# Patient Record
Sex: Male | Born: 1980 | Race: Black or African American | Hispanic: No | Marital: Single | Smoking: Never smoker
Health system: Southern US, Community
[De-identification: ages and names within clinical notes are randomized; demographics above are authoritative.]

## PROBLEM LIST (undated history)

## (undated) DIAGNOSIS — I10 Essential (primary) hypertension: Secondary | ICD-10-CM

## (undated) HISTORY — PX: APPENDECTOMY: SHX54

---

## 2004-05-31 ENCOUNTER — Emergency Department: Payer: Self-pay | Admitting: Emergency Medicine

## 2006-04-22 ENCOUNTER — Emergency Department: Payer: Self-pay | Admitting: Internal Medicine

## 2007-02-16 ENCOUNTER — Emergency Department: Payer: Self-pay | Admitting: Emergency Medicine

## 2007-04-18 ENCOUNTER — Emergency Department: Payer: Self-pay | Admitting: Internal Medicine

## 2007-08-05 ENCOUNTER — Emergency Department: Payer: Self-pay | Admitting: Internal Medicine

## 2009-09-07 ENCOUNTER — Emergency Department: Payer: Self-pay | Admitting: Unknown Physician Specialty

## 2012-01-08 ENCOUNTER — Emergency Department: Payer: Self-pay | Admitting: Emergency Medicine

## 2012-06-24 ENCOUNTER — Emergency Department: Payer: Self-pay | Admitting: Emergency Medicine

## 2013-06-23 ENCOUNTER — Emergency Department: Payer: Self-pay | Admitting: Emergency Medicine

## 2013-09-20 ENCOUNTER — Emergency Department: Payer: Self-pay | Admitting: Emergency Medicine

## 2013-09-22 LAB — BETA STREP CULTURE(ARMC)

## 2014-01-27 ENCOUNTER — Emergency Department: Payer: Self-pay | Admitting: Emergency Medicine

## 2014-01-29 ENCOUNTER — Emergency Department: Payer: Self-pay | Admitting: Emergency Medicine

## 2014-01-29 LAB — COMPREHENSIVE METABOLIC PANEL
ALBUMIN: 4.5 g/dL (ref 3.4–5.0)
ALT: 103 U/L — AB
Alkaline Phosphatase: 60 U/L
Anion Gap: 18 — ABNORMAL HIGH (ref 7–16)
BUN: 17 mg/dL (ref 7–18)
Bilirubin,Total: 0.3 mg/dL (ref 0.2–1.0)
CALCIUM: 8.6 mg/dL (ref 8.5–10.1)
CHLORIDE: 101 mmol/L (ref 98–107)
CO2: 21 mmol/L (ref 21–32)
Creatinine: 1.5 mg/dL — ABNORMAL HIGH (ref 0.60–1.30)
EGFR (African American): >60
EGFR (Non-African Amer.): 58 — ABNORMAL LOW
Glucose: 142 mg/dL — ABNORMAL HIGH (ref 65–99)
Osmolality: 283 (ref 275–301)
Potassium: 2.9 mmol/L — ABNORMAL LOW (ref 3.5–5.1)
SGOT(AST): 65 U/L — ABNORMAL HIGH (ref 15–37)
SODIUM: 140 mmol/L (ref 136–145)
Total Protein: 9.3 g/dL — ABNORMAL HIGH (ref 6.4–8.2)

## 2014-01-29 LAB — CBC
HCT: 47.8 % (ref 40.0–52.0)
HGB: 15.4 g/dL (ref 13.0–18.0)
MCH: 27.6 pg (ref 26.0–34.0)
MCHC: 32.2 g/dL (ref 32.0–36.0)
MCV: 86 fL (ref 80–100)
Platelet: 236 10*3/uL (ref 150–440)
RBC: 5.58 10*6/uL (ref 4.40–5.90)
RDW: 18 % — AB (ref 11.5–14.5)
WBC: 12.2 10*3/uL — AB (ref 3.8–10.6)

## 2014-01-31 ENCOUNTER — Emergency Department: Payer: Self-pay | Admitting: Emergency Medicine

## 2014-07-11 ENCOUNTER — Emergency Department
Admit: 2014-07-11 | Disposition: A | Discharge status: Home / Self Care | Payer: Self-pay | Admitting: Emergency Medicine

## 2014-07-13 LAB — BETA STREP CULTURE(ARMC)

## 2015-10-28 IMAGING — CR RIGHT MIDDLE FINGER 2+V
1 series · 3 of 3 positions shown · non-contrast
Comparison: None.

CLINICAL DATA: Pain about the proximal aspect of the right long
finger with swelling. Symptoms for 3 days. Initial encounter. No
known injury.

EXAM:
RIGHT MIDDLE FINGER 2+V

[Series 1: pa · 0.17mm/px · 3 of 3 slices shown]
[im 1/3]
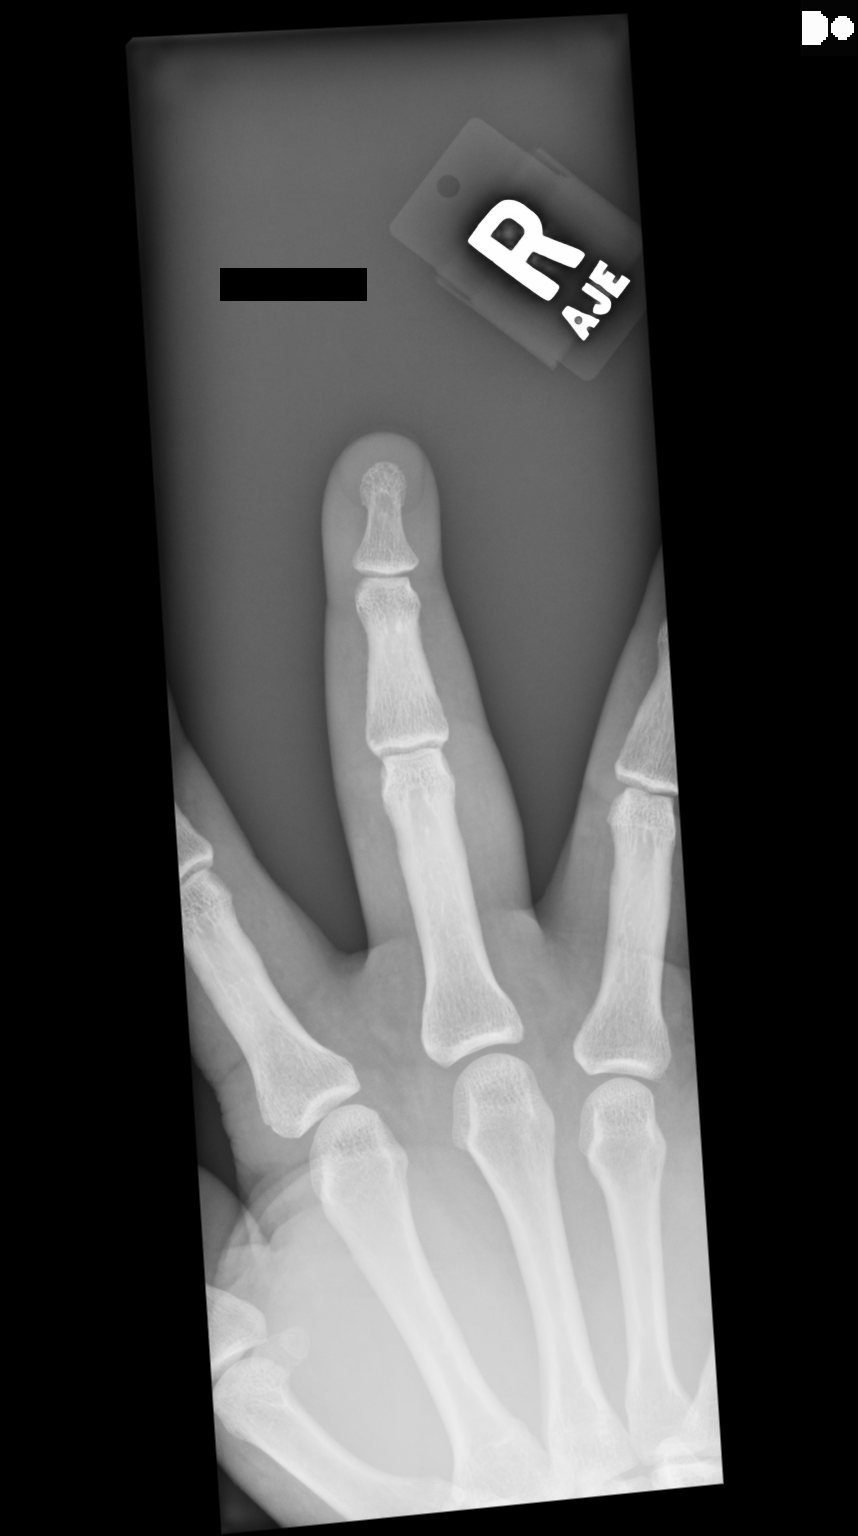
[im 2/3]
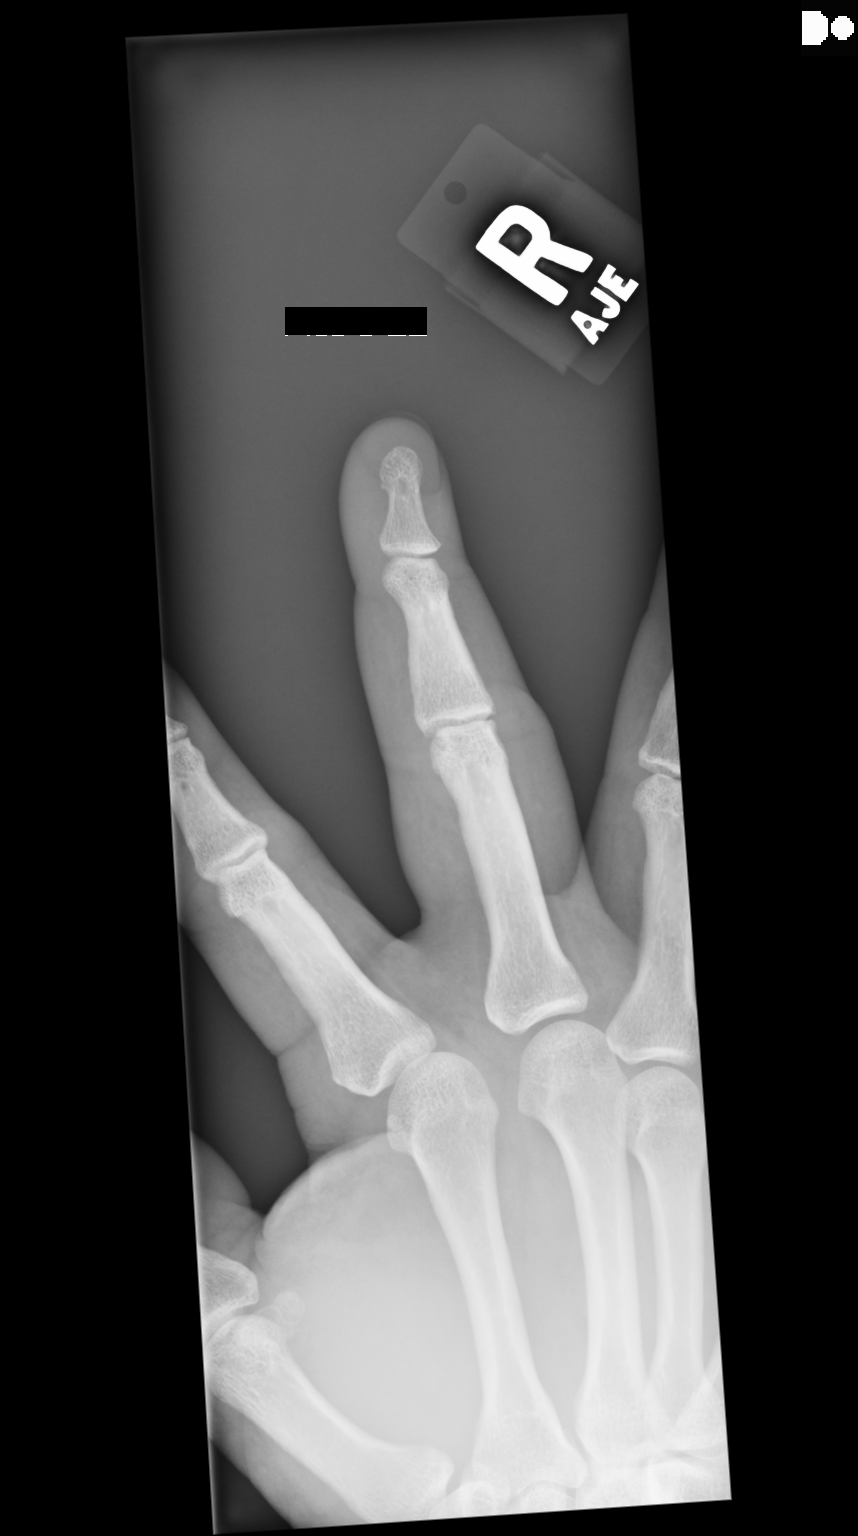
[im 3/3]
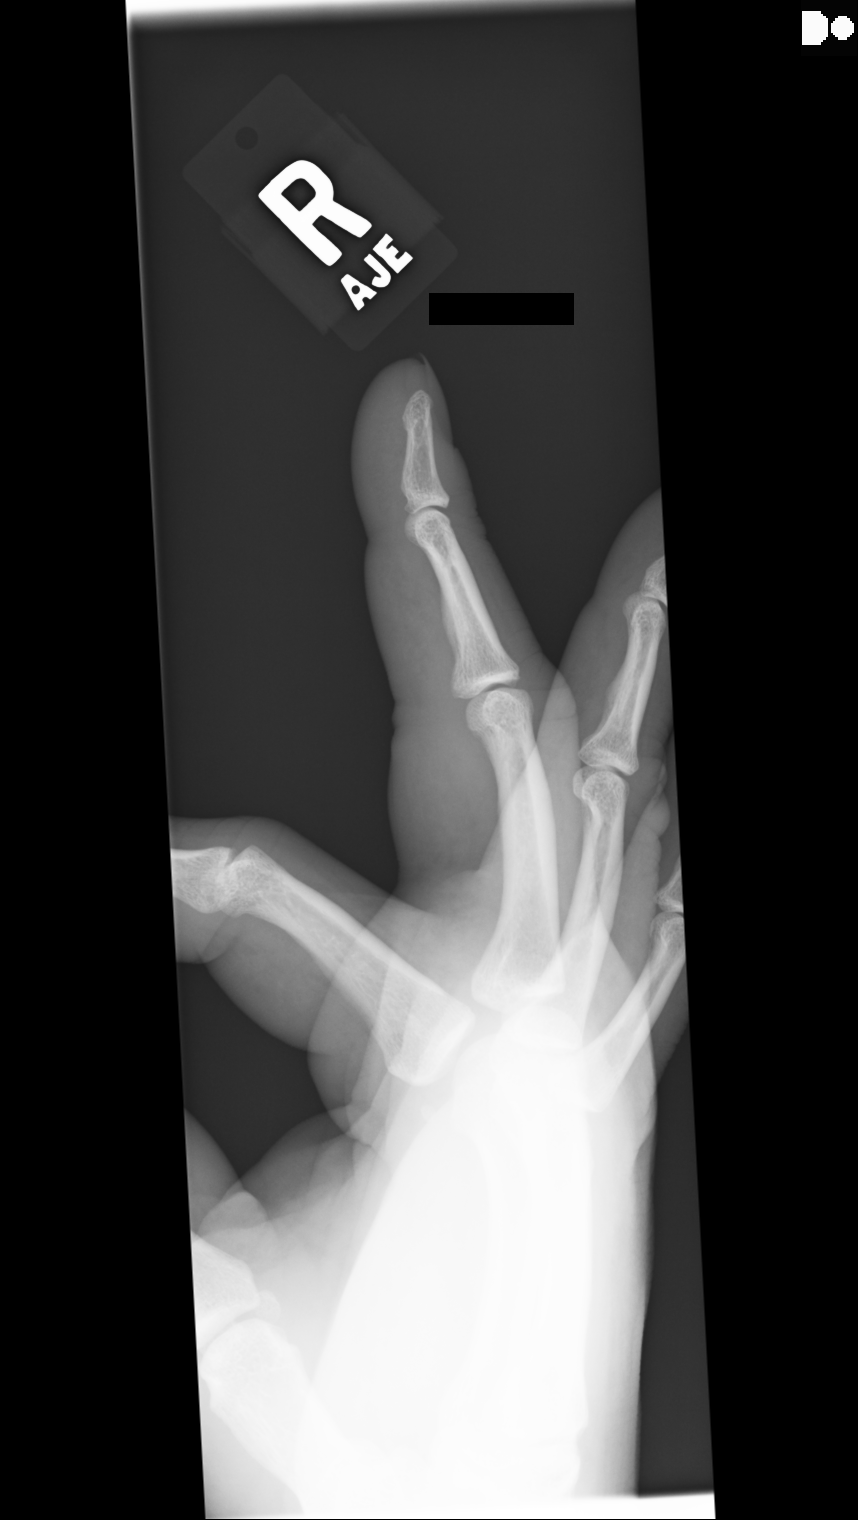

[3 of 3 positions shown; findings below may reference images not displayed]

FINDINGS: Soft tissues of the right long finger appear swollen. No soft tissue
gas collection or radiopaque foreign body is identified. Image bones
and joints appear normal.
IMPRESSION: Soft tissue swelling.  Otherwise negative.

## 2017-05-29 ENCOUNTER — Other Ambulatory Visit: Payer: Self-pay | Admitting: Family Medicine

## 2017-05-29 DIAGNOSIS — R748 Abnormal levels of other serum enzymes: Secondary | ICD-10-CM

## 2017-06-11 ENCOUNTER — Ambulatory Visit: Payer: BLUE CROSS/BLUE SHIELD

## 2018-09-23 ENCOUNTER — Emergency Department
Admission: EM | Admit: 2018-09-23 | Discharge: 2018-09-23 | Disposition: A | Discharge status: Home / Self Care | Payer: No Typology Code available for payment source | Attending: Emergency Medicine | Admitting: Emergency Medicine

## 2018-09-23 ENCOUNTER — Other Ambulatory Visit: Payer: Self-pay

## 2018-09-23 ENCOUNTER — Emergency Department: Payer: No Typology Code available for payment source

## 2018-09-23 ENCOUNTER — Encounter: Payer: Self-pay | Admitting: Emergency Medicine

## 2018-09-23 DIAGNOSIS — Y9389 Activity, other specified: Secondary | ICD-10-CM | POA: Insufficient documentation

## 2018-09-23 DIAGNOSIS — S51812A Laceration without foreign body of left forearm, initial encounter: Secondary | ICD-10-CM | POA: Insufficient documentation

## 2018-09-23 DIAGNOSIS — Y998 Other external cause status: Secondary | ICD-10-CM | POA: Insufficient documentation

## 2018-09-23 DIAGNOSIS — Y92410 Unspecified street and highway as the place of occurrence of the external cause: Secondary | ICD-10-CM | POA: Insufficient documentation

## 2018-09-23 DIAGNOSIS — Y999 Unspecified external cause status: Secondary | ICD-10-CM | POA: Insufficient documentation

## 2018-09-23 DIAGNOSIS — T148XXA Other injury of unspecified body region, initial encounter: Secondary | ICD-10-CM

## 2018-09-23 DIAGNOSIS — I10 Essential (primary) hypertension: Secondary | ICD-10-CM | POA: Insufficient documentation

## 2018-09-23 HISTORY — DX: Essential (primary) hypertension: I10

## 2018-09-23 MED ORDER — SODIUM CHLORIDE 0.9 % IV BOLUS: 1000.0000 mL | Freq: Once | INTRAVENOUS | Status: DC
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
## 2018-09-23 NOTE — ED Notes (Signed)
Legal blood draw performed by this nurse with [facility]. Pt tolerated well.

## 2018-09-23 NOTE — Discharge Instructions (Addendum)
Please keep your cuts/abrasions clean, you may wash with warm water and mild soap.  Keep covered with Neosporin and a dressing/bandage.  Return to the emergency department for development of any other symptoms such as significant headache, chest pain or abdominal pain.

## 2019-04-25 ENCOUNTER — Encounter: Payer: Self-pay | Admitting: Intensive Care

## 2019-04-25 ENCOUNTER — Emergency Department
Admission: EM | Admit: 2019-04-25 | Discharge: 2019-04-25 | Disposition: A | Discharge status: Home / Self Care | Payer: Self-pay | Attending: Emergency Medicine | Admitting: Emergency Medicine

## 2019-04-25 ENCOUNTER — Other Ambulatory Visit: Payer: Self-pay

## 2019-04-25 DIAGNOSIS — I10 Essential (primary) hypertension: Secondary | ICD-10-CM | POA: Insufficient documentation

## 2019-04-25 DIAGNOSIS — Z79899 Other long term (current) drug therapy: Secondary | ICD-10-CM | POA: Insufficient documentation

## 2019-04-25 MED ORDER — AMLODIPINE BESYLATE 5 MG PO TABS: 5.0000 mg | ORAL_TABLET | Freq: Every day | ORAL | 0 refills | Status: AC

## 2019-04-25 NOTE — ED Provider Notes (Signed)
[facility] Emergency Department Provider Note  ____________________________________________  Time seen: Approximately 4:40 PM  I have reviewed the triage vital signs and the nursing notes.   HISTORY  Chief Complaint Hypertension    HPI Dennis Jensen is a 39 y.o. male that presents to the emergency department requesting blood pressure medication.  Patient states that he had a short episode yesterday where he felt dizzy.  Patient is unsure of how long episode lasted but states that it was short.  After episode, patient felt normal.  Patient has not had his blood pressure medication in 2 months.  He was taking amlodipine and hydrochlorothiazide but is unsure of the doses.  Patient ran out of insurance and was unable to afford his medications so he quit taking them.  Patient denies headache, visual changes, weakness, shortness of breath, chest pain, abdominal pain.   Past Medical History:  Diagnosis Date  . Hypertension     There are no problems to display for this patient.   Past Surgical History:  Procedure Laterality Date  . APPENDECTOMY      Prior to Admission medications   Medication Sig Start Date End Date Taking? Authorizing Provider  amLODipine (NORVASC) 5 MG tablet Take 1 tablet (5 mg total) by mouth daily. 04/25/19 04/24/20  Laban Emperor, PA-C    Allergies Patient has no known allergies.  History reviewed. No pertinent family history.  Social History Social History   Tobacco Use  . Smoking status: Never Smoker  . Smokeless tobacco: Never Used  Substance Use Topics  . Alcohol use: Yes    Alcohol/week: 12.0 standard drinks    Types: 12 Cans of beer per week  . Drug use: Never     Review of Systems  Cardiovascular: No chest pain. Respiratory: No SOB. Gastrointestinal: No abdominal pain.  No nausea, no vomiting.  Musculoskeletal: Negative for musculoskeletal pain. Skin: Negative for rash, abrasions, lacerations,  ecchymosis. Neurological: Negative for headaches, numbness or tingling   ____________________________________________   PHYSICAL EXAM:  VITAL SIGNS: ED Triage Vitals  Enc Vitals Group     BP 04/25/19 1620 (!) 157/101     Pulse Rate 04/25/19 1620 87     Resp 04/25/19 1620 16     Temp 04/25/19 1620 98.8 F (37.1 C)     Temp Source 04/25/19 1620 Oral     SpO2 04/25/19 1620 98 %     Weight 04/25/19 1621 250 lb (113.4 kg)     Height 04/25/19 1621 5\' 10"  (1.778 m)     Head Circumference --      Peak Flow --      Pain Score 04/25/19 1621 0     Pain Loc --      Pain Edu? --      Excl. in [place]? --      Constitutional: Alert and oriented. Well appearing and in no acute distress. Eyes: Conjunctivae are normal. PERRL. EOMI. Head: Atraumatic. ENT:      Ears:      Nose: No congestion/rhinnorhea.      Mouth/Throat: Mucous membranes are moist.  Neck: No stridor.  Cardiovascular: Normal rate, regular rhythm.  Good peripheral circulation. Respiratory: Normal respiratory effort without tachypnea or retractions. Lungs CTAB. Good air entry to the bases with no decreased or absent breath sounds. Musculoskeletal: Full range of motion to all extremities. No gross deformities appreciated. Neurologic: Normal speech and language. No gross focal neurologic deficits are appreciated.  Cranial nerves: 2-10 normal as tested. Strength  5/5 in upper and lower extremities Cerebellar: Finger-nose-finger WNL, Heel to shin WNL Sensorimotor: No pronator drift, clonus, sensory loss or abnormal reflexes.  Speech: No dysarthria or expressive aphasia Skin:  Skin is warm, dry and intact. No rash noted. Psychiatric: Mood and affect are normal. Speech and behavior are normal. Patient exhibits appropriate insight and judgement.   ____________________________________________   LABS (all labs ordered are listed, but only abnormal results are displayed)  Labs Reviewed - No data to  display ____________________________________________  EKG   ____________________________________________  RADIOLOGY  No results found.  ____________________________________________    PROCEDURES  Procedure(s) performed:    Procedures    Medications - No data to display   ____________________________________________   INITIAL IMPRESSION / ASSESSMENT AND PLAN / ED COURSE  Pertinent labs & imaging results that were available during my care of the patient were reviewed by me and considered in my medical decision making (see chart for details).  Review of the [place] CSRS was performed in accordance of the [facility] prior to dispensing any controlled drugs.  Patient's diagnosis is consistent with hypertension.  Vital signs and exam are reassuring.  Neuro exam within normal limits.  Risk and benefits of imaging were discussed with the patient and he declines CT scan at this time.  Patient will be discharged home with prescriptions for amlodipine.  Patient is unsure of his dose of amlodipine and hydrochlorothiazide.  He will be given a prescription for a low dose of amlodipine and follow-up with primary care for blood pressure recheck and medication refill.  Patient is to follow up with primary care as directed. Patient is given ED precautions to return to the ED for any worsening or new symptoms.  Dennis Jensen was evaluated in Emergency Department on 04/25/2019 for the symptoms described in the history of present illness. He was evaluated in the context of the global COVID-19 pandemic, which necessitated consideration that the patient might be at risk for infection with the SARS-CoV-2 virus that causes COVID-19. Institutional protocols and algorithms that pertain to the evaluation of patients at risk for COVID-19 are in a state of rapid change based on information released by regulatory bodies including the CDC and federal and state organizations. These policies and algorithms were followed  during the patient's care in the ED.   ____________________________________________  FINAL CLINICAL IMPRESSION(S) / ED DIAGNOSES  Final diagnoses:  Hypertension, unspecified type      NEW MEDICATIONS STARTED DURING THIS VISIT:  ED Discharge Orders         Ordered    amLODipine (NORVASC) 5 MG tablet  Daily     04/25/19 1729              This chart was dictated using voice recognition software/Dragon. Despite best efforts to proofread, errors can occur which can change the meaning. Any change was purely unintentional.    Enid Derry, PA-C 04/25/19 2215    Shaune Pollack, MD 04/26/19 [phone]

## 2019-04-25 NOTE — ED Triage Notes (Signed)
Patient reports he has not had his blood pressure medicine in about two months and felt like his blood pressure is high. Denies any symptoms at this time

## 2019-07-08 ENCOUNTER — Ambulatory Visit: Payer: Self-pay | Admitting: Physician Assistant

## 2019-07-08 ENCOUNTER — Encounter: Payer: Self-pay | Admitting: Physician Assistant

## 2019-07-08 ENCOUNTER — Other Ambulatory Visit: Payer: Self-pay

## 2019-07-08 DIAGNOSIS — A5401 Gonococcal cystitis and urethritis, unspecified: Secondary | ICD-10-CM

## 2019-07-08 DIAGNOSIS — Z113 Encounter for screening for infections with a predominantly sexual mode of transmission: Secondary | ICD-10-CM

## 2019-07-08 LAB — GRAM STAIN

## 2019-07-08 MED ORDER — DOXYCYCLINE HYCLATE 100 MG PO TABS: 100.0000 mg | ORAL_TABLET | Freq: Two times a day (BID) | ORAL | 0 refills | Status: AC

## 2019-07-08 MED ORDER — CEFTRIAXONE SODIUM 250 MG IJ SOLR
500.0000 mg | Freq: Once | INTRAMUSCULAR | Status: AC
  Administered 2019-07-08: 15:00:00 500 mg via INTRAMUSCULAR

## 2019-07-08 NOTE — Progress Notes (Signed)
Gram Stain results reviewed. Patient treated for Gonorrhea per provider orders. Tawny Hopping, RN

## 2019-07-08 NOTE — Progress Notes (Signed)
   [facility] Department STI clinic/screening visit  Subjective:  Dennis Jensen is a 39 y.o. male being seen today for an STI screening visit. The patient reports they do have symptoms.    Patient has the following medical conditions:  There are no problems to display for this patient.    Chief Complaint  Patient presents with  . SEXUALLY TRANSMITTED DISEASE    HPI  Patient reports that he has had a clear/whitish discharge for the last 2 days.  Denies other symptoms and would like a screening.  Declines blood work today.   See flowsheet for further details and programmatic requirements.    The following portions of the patient's history were reviewed and updated as appropriate: allergies, current medications, past medical history, past social history, past surgical history and problem list.  Objective:  There were no vitals filed for this visit.  Physical Exam Constitutional:      General: He is not in acute distress.    Appearance: Normal appearance. He is obese.  HENT:     Head: Normocephalic and atraumatic.     Comments: No nits, lice or hair loss. No cervical, supraclavicular or axillary adenopathy.     Mouth/Throat:     Mouth: Mucous membranes are moist.     Pharynx: Oropharynx is clear. No oropharyngeal exudate or posterior oropharyngeal erythema.  Eyes:     Conjunctiva/sclera: Conjunctivae normal.  Pulmonary:     Effort: Pulmonary effort is normal.  Abdominal:     Palpations: Abdomen is soft. There is no mass.     Tenderness: There is no abdominal tenderness. There is no rebound.  Genitourinary:    Penis: Normal.      Testes: Normal.     Comments: Pubic area without nits, lice, edema, erythema, lesions and inguinal adenopathy. Penis circumcised, without rash or lesions. Small amount of grayish discharge at meatus. Musculoskeletal:     Cervical back: Neck supple. No tenderness.  Skin:    General: Skin is warm and dry.     Findings: No  bruising, erythema, lesion or rash.  Neurological:     Mental Status: He is alert and oriented to person, place, and time.  Psychiatric:        Mood and Affect: Mood normal.        Behavior: Behavior normal.        Thought Content: Thought content normal.        Judgment: Judgment normal.       Assessment and Plan:  Dennis Jensen is a 39 y.o. male presenting to the [facility] Department for STI screening  1. Screening for STD (sexually transmitted disease) Patient into clinic with symptoms.  Declines blood work today. Rec condoms with all sex.  - Gram stain  2. Gonococcal urethritis in male Treat for GC and possible Chlamydia with Ceftriaxone 500mg   IM and Doxycycline 100 mg #14 1 po BID for 7 days. No sex for 14 days, until 7 days after completing treatment and until after partner completes treatment. Call clinic with any questions or concerns. - cefTRIAXone (ROCEPHIN) injection 500 mg - doxycycline (VIBRA-TABS) 100 MG tablet; Take 1 tablet (100 mg total) by mouth 2 (two) times daily for 7 days.  Dispense: 14 tablet; Refill: 0     No follow-ups on file.  No future appointments.  , PA

## 2019-07-08 NOTE — Progress Notes (Signed)
Here today for STD screening. Declines bloodwork. Quindarius Cabello, RN ? ?

## 2019-10-04 ENCOUNTER — Other Ambulatory Visit: Payer: Self-pay

## 2019-10-04 ENCOUNTER — Ambulatory Visit: Payer: Self-pay | Admitting: Physician Assistant

## 2019-10-04 DIAGNOSIS — Z113 Encounter for screening for infections with a predominantly sexual mode of transmission: Secondary | ICD-10-CM

## 2019-10-05 ENCOUNTER — Encounter: Payer: Self-pay | Admitting: Physician Assistant

## 2019-10-05 LAB — GRAM STAIN

## 2019-10-05 NOTE — Progress Notes (Signed)
   [facility] Department STI clinic/screening visit  Subjective:  Dennis Jensen is a 39 y.o. male being seen today for an STI screening visit. The patient reports they do not have symptoms.    Patient has the following medical conditions:  There are no problems to display for this patient.    Chief Complaint  Patient presents with  . SEXUALLY TRANSMITTED DISEASE    screening    HPI  Patient reports that he is not having any symptoms but wants to make sure everything cleared up from last visit.  Reports that he has HTN and takes medication for this condition.  Reports h/o appendectomy.  Last HIV test was 07/2019.  Last void prior to sample collection was over 2 hr.   See flowsheet for further details and programmatic requirements.    The following portions of the patient's history were reviewed and updated as appropriate: allergies, current medications, past medical history, past social history, past surgical history and problem list.  Objective:  There were no vitals filed for this visit.  Physical Exam Constitutional:      General: He is not in acute distress.    Appearance: Normal appearance.  HENT:     Head: Normocephalic and atraumatic.     Comments: No nits, lice or hair loss. No cervical, supraclavicular or axillary adenopathy.    Mouth/Throat:     Mouth: Mucous membranes are moist.     Pharynx: Oropharynx is clear. No oropharyngeal exudate or posterior oropharyngeal erythema.  Eyes:     Conjunctiva/sclera: Conjunctivae normal.  Pulmonary:     Effort: Pulmonary effort is normal.  Abdominal:     Palpations: Abdomen is soft. There is no mass.     Tenderness: There is no abdominal tenderness. There is no guarding or rebound.  Genitourinary:    Penis: Normal.      Testes: Normal.     Comments: Pubic area without nits, lice, edema, erythema, lesions and inguinal adenopathy. Penis without rash, lesions and discharge at meatus. Musculoskeletal:      Cervical back: Neck supple. No tenderness.  Skin:    General: Skin is warm and dry.     Findings: No bruising, erythema, lesion or rash.  Neurological:     Mental Status: He is alert and oriented to person, place, and time.  Psychiatric:        Mood and Affect: Mood normal.        Behavior: Behavior normal.        Thought Content: Thought content normal.        Judgment: Judgment normal.       Assessment and Plan:  Dennis Jensen is a 39 y.o. male presenting to the [facility] Department for STI screening  1. Screening for STD (sexually transmitted disease) Patient into clinic without symptoms.  Declines blood work today. Reviewed exam and Gram stain findings with patient and no treatment indicated today. Rec condoms with all sex. Await test results.  Counseled that RN will call if needs to RTC for treatment once results are back. - Gram stain - Gonococcus culture     No follow-ups on file.  No future appointments.  Matt Holmes, PA

## 2019-10-09 LAB — GONOCOCCUS CULTURE

## 2019-10-19 ENCOUNTER — Telehealth: Payer: Self-pay

## 2019-10-19 NOTE — Telephone Encounter (Signed)
Individual has been contacted 3+ times regarding ED referral. No further attempts to contact individual will be made. 
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
## 2023-03-06 ENCOUNTER — Emergency Department (HOSPITAL_COMMUNITY): Payer: 59 | Admitting: Certified Registered Nurse Anesthetist

## 2023-03-06 ENCOUNTER — Emergency Department (HOSPITAL_COMMUNITY): Payer: 59

## 2023-03-06 ENCOUNTER — Other Ambulatory Visit: Payer: Self-pay

## 2023-03-06 ENCOUNTER — Inpatient Hospital Stay (HOSPITAL_COMMUNITY)
Admission: EM | Admit: 2023-03-06 | Discharge: 2023-03-11 | DRG: 357 | Disposition: A | Discharge status: Home / Self Care | Payer: 59 | Attending: General Surgery | Admitting: General Surgery

## 2023-03-06 ENCOUNTER — Encounter (HOSPITAL_COMMUNITY): Payer: Self-pay | Admitting: Certified Registered Nurse Anesthetist

## 2023-03-06 ENCOUNTER — Encounter (HOSPITAL_COMMUNITY): Admission: EM | Disposition: A | Discharge status: Home / Self Care | Payer: Self-pay | Source: Home / Self Care

## 2023-03-06 DIAGNOSIS — I10 Essential (primary) hypertension: Secondary | ICD-10-CM | POA: Diagnosis present

## 2023-03-06 DIAGNOSIS — S31630A Puncture wound without foreign body of abdominal wall, right upper quadrant with penetration into peritoneal cavity, initial encounter: Principal | ICD-10-CM | POA: Diagnosis present

## 2023-03-06 DIAGNOSIS — K567 Ileus, unspecified: Secondary | ICD-10-CM | POA: Diagnosis not present

## 2023-03-06 DIAGNOSIS — S31119A Laceration without foreign body of abdominal wall, unspecified quadrant without penetration into peritoneal cavity, initial encounter: Principal | ICD-10-CM

## 2023-03-06 DIAGNOSIS — S36114A Minor laceration of liver, initial encounter: Secondary | ICD-10-CM | POA: Diagnosis present

## 2023-03-06 DIAGNOSIS — E669 Obesity, unspecified: Secondary | ICD-10-CM | POA: Diagnosis present

## 2023-03-06 DIAGNOSIS — Y9301 Activity, walking, marching and hiking: Secondary | ICD-10-CM | POA: Diagnosis present

## 2023-03-06 DIAGNOSIS — W269XXA Contact with unspecified sharp object(s), initial encounter: Secondary | ICD-10-CM

## 2023-03-06 DIAGNOSIS — Z6841 Body Mass Index (BMI) 40.0 and over, adult: Secondary | ICD-10-CM | POA: Diagnosis not present

## 2023-03-06 DIAGNOSIS — Y92410 Unspecified street and highway as the place of occurrence of the external cause: Secondary | ICD-10-CM

## 2023-03-06 DIAGNOSIS — Z23 Encounter for immunization: Secondary | ICD-10-CM | POA: Diagnosis not present

## 2023-03-06 DIAGNOSIS — S31149A Puncture wound of abdominal wall with foreign body, unspecified quadrant without penetration into peritoneal cavity, initial encounter: Secondary | ICD-10-CM | POA: Diagnosis not present

## 2023-03-06 DIAGNOSIS — T148XXA Other injury of unspecified body region, initial encounter: Secondary | ICD-10-CM | POA: Diagnosis present

## 2023-03-06 DIAGNOSIS — S301XXA Contusion of abdominal wall, initial encounter: Secondary | ICD-10-CM | POA: Diagnosis present

## 2023-03-06 HISTORY — PX: APPLICATION OF WOUND VAC: SHX5189

## 2023-03-06 HISTORY — PX: LAPAROTOMY: SHX154

## 2023-03-06 LAB — I-STAT CHEM 8, ED
BUN: 16 mg/dL (ref 6–20)
Calcium, Ion: 1.05 mmol/L — ABNORMAL LOW (ref 1.15–1.40)
Chloride: 99 mmol/L (ref 98–111)
Creatinine, Ser: 1.6 mg/dL — ABNORMAL HIGH (ref 0.61–1.24)
Glucose, Bld: 154 mg/dL — ABNORMAL HIGH (ref 70–99)
HCT: 50 % (ref 39.0–52.0)
Hemoglobin: 17 g/dL (ref 13.0–17.0)
Potassium: 3.8 mmol/L (ref 3.5–5.1)
Sodium: 137 mmol/L (ref 135–145)
TCO2: 25 mmol/L (ref 22–32)

## 2023-03-06 LAB — CBC
HCT: 43.8 % (ref 39.0–52.0)
HCT: 36.8 % — ABNORMAL LOW (ref 39.0–52.0)
Hemoglobin: 14.9 g/dL (ref 13.0–17.0)
Hemoglobin: 12.4 g/dL — ABNORMAL LOW (ref 13.0–17.0)
MCH: 28 pg (ref 26.0–34.0)
MCH: 27.6 pg (ref 26.0–34.0)
MCHC: 34 g/dL (ref 30.0–36.0)
MCHC: 33.7 g/dL (ref 30.0–36.0)
MCV: 82.3 fL (ref 80.0–100.0)
MCV: 82 fL (ref 80.0–100.0)
Platelets: 213 10*3/uL (ref 150–400)
Platelets: 180 10*3/uL (ref 150–400)
RBC: 5.32 MIL/uL (ref 4.22–5.81)
RBC: 4.49 MIL/uL (ref 4.22–5.81)
RDW: 18.5 % — ABNORMAL HIGH (ref 11.5–15.5)
RDW: 18 % — ABNORMAL HIGH (ref 11.5–15.5)
WBC: 7.2 10*3/uL (ref 4.0–10.5)
WBC: 8.7 10*3/uL (ref 4.0–10.5)
nRBC: 0 % (ref 0.0–0.2)
nRBC: 0 % (ref 0.0–0.2)

## 2023-03-06 LAB — BASIC METABOLIC PANEL
Anion gap: 13 (ref 5–15)
BUN: 8 mg/dL (ref 6–20)
CO2: 20 mmol/L — ABNORMAL LOW (ref 22–32)
Calcium: 8.5 mg/dL — ABNORMAL LOW (ref 8.9–10.3)
Chloride: 100 mmol/L (ref 98–111)
Creatinine, Ser: 1.03 mg/dL (ref 0.61–1.24)
GFR, Estimated: >60 mL/min (ref >60)
Glucose, Bld: 131 mg/dL — ABNORMAL HIGH (ref 70–99)
Potassium: 3.8 mmol/L (ref 3.5–5.1)
Sodium: 133 mmol/L — ABNORMAL LOW (ref 135–145)

## 2023-03-06 LAB — COMPREHENSIVE METABOLIC PANEL
ALT: 61 U/L — ABNORMAL HIGH (ref 0–44)
AST: 73 U/L — ABNORMAL HIGH (ref 15–41)
Albumin: 4.4 g/dL (ref 3.5–5.0)
Alkaline Phosphatase: 54 U/L (ref 38–126)
Anion gap: 15 (ref 5–15)
BUN: 12 mg/dL (ref 6–20)
CO2: 20 mmol/L — ABNORMAL LOW (ref 22–32)
Calcium: 9.2 mg/dL (ref 8.9–10.3)
Chloride: 99 mmol/L (ref 98–111)
Creatinine, Ser: 1.34 mg/dL — ABNORMAL HIGH (ref 0.61–1.24)
GFR, Estimated: >60 mL/min (ref >60)
Glucose, Bld: 149 mg/dL — ABNORMAL HIGH (ref 70–99)
Potassium: 3.8 mmol/L (ref 3.5–5.1)
Sodium: 134 mmol/L — ABNORMAL LOW (ref 135–145)
Total Bilirubin: 1.1 mg/dL (ref <1.2)
Total Protein: 8.4 g/dL — ABNORMAL HIGH (ref 6.5–8.1)

## 2023-03-06 LAB — HIV ANTIBODY (ROUTINE TESTING W REFLEX): HIV Screen 4th Generation wRfx: NONREACTIVE

## 2023-03-06 LAB — TYPE AND SCREEN
ABO/RH(D): O POS
Antibody Screen: NEGATIVE

## 2023-03-06 LAB — PHOSPHORUS: Phosphorus: 3.9 mg/dL (ref 2.5–4.6)

## 2023-03-06 LAB — I-STAT CG4 LACTIC ACID, ED: Lactic Acid, Venous: 3 mmol/L (ref 0.5–1.9)

## 2023-03-06 LAB — ETHANOL: Alcohol, Ethyl (B): 248 mg/dL — ABNORMAL HIGH (ref <10)

## 2023-03-06 LAB — SAMPLE TO BLOOD BANK

## 2023-03-06 LAB — PROTIME-INR
INR: 1 (ref 0.8–1.2)
Prothrombin Time: 12.8 s (ref 11.4–15.2)

## 2023-03-06 LAB — ABO/RH: ABO/RH(D): O POS

## 2023-03-06 LAB — MAGNESIUM: Magnesium: 1.9 mg/dL (ref 1.7–2.4)

## 2023-03-06 SURGERY — LAPAROTOMY, EXPLORATORY
Anesthesia: General | Site: Abdomen

## 2023-03-06 MED ORDER — POLYETHYLENE GLYCOL 3350 17 G PO PACK: 17.0000 g | PACK | Freq: Every day | ORAL | Status: DC | PRN

## 2023-03-06 MED ORDER — SUGAMMADEX SODIUM 200 MG/2ML IV SOLN
INTRAVENOUS | Status: DC | PRN
  Administered 2023-03-06: 400 mg via INTRAVENOUS

## 2023-03-06 MED ORDER — ALBUMIN HUMAN 5 % IV SOLN: INTRAVENOUS | Status: DC | PRN

## 2023-03-06 MED ORDER — CEFAZOLIN SODIUM-DEXTROSE 2-4 GM/100ML-% IV SOLN
2.0000 g | Freq: Once | INTRAVENOUS | Status: AC
  Administered 2023-03-06: 2 g via INTRAVENOUS
  Filled 2023-03-06: qty 100

## 2023-03-06 MED ORDER — VASOPRESSIN 20 UNIT/ML IV SOLN
INTRAVENOUS | Status: AC
  Filled 2023-03-06: qty 1

## 2023-03-06 MED ORDER — ACETAMINOPHEN 160 MG/5ML PO SOLN: 1000.0000 mg | Freq: Once | ORAL | Status: DC | PRN

## 2023-03-06 MED ORDER — ONDANSETRON HCL 4 MG/2ML IJ SOLN
INTRAMUSCULAR | Status: DC | PRN
  Administered 2023-03-06: 4 mg via INTRAVENOUS

## 2023-03-06 MED ORDER — MIDAZOLAM HCL 5 MG/5ML IJ SOLN
INTRAMUSCULAR | Status: DC | PRN
  Administered 2023-03-06: 2 mg via INTRAVENOUS

## 2023-03-06 MED ORDER — SUCCINYLCHOLINE CHLORIDE 200 MG/10ML IV SOSY
PREFILLED_SYRINGE | INTRAVENOUS | Status: DC | PRN
  Administered 2023-03-06: 180 mg via INTRAVENOUS

## 2023-03-06 MED ORDER — ACETAMINOPHEN 500 MG PO TABS
1000.0000 mg | ORAL_TABLET | Freq: Four times a day (QID) | ORAL | Status: DC
  Administered 2023-03-06 – 2023-03-11 (×17): 1000 mg via ORAL
  Filled 2023-03-06 (×17): qty 2

## 2023-03-06 MED ORDER — PHENYLEPHRINE HCL (PRESSORS) 10 MG/ML IV SOLN
INTRAVENOUS | Status: DC | PRN
  Administered 2023-03-06: 80 ug via INTRAVENOUS
  Administered 2023-03-06: 160 ug via INTRAVENOUS
  Administered 2023-03-06: 80 ug via INTRAVENOUS

## 2023-03-06 MED ORDER — OXYCODONE HCL 5 MG PO TABS: 5.0000 mg | ORAL_TABLET | Freq: Once | ORAL | Status: DC | PRN

## 2023-03-06 MED ORDER — PROPOFOL 10 MG/ML IV BOLUS
INTRAVENOUS | Status: AC
  Filled 2023-03-06: qty 20

## 2023-03-06 MED ORDER — OXYCODONE HCL 5 MG/5ML PO SOLN: 5.0000 mg | Freq: Once | ORAL | Status: DC | PRN

## 2023-03-06 MED ORDER — ONDANSETRON HCL 4 MG/2ML IJ SOLN: 4.0000 mg | Freq: Four times a day (QID) | INTRAMUSCULAR | Status: DC | PRN

## 2023-03-06 MED ORDER — LIDOCAINE 2% (20 MG/ML) 5 ML SYRINGE
INTRAMUSCULAR | Status: AC
  Filled 2023-03-06: qty 5

## 2023-03-06 MED ORDER — ACETAMINOPHEN 500 MG PO TABS: 1000.0000 mg | ORAL_TABLET | Freq: Four times a day (QID) | ORAL | Status: DC

## 2023-03-06 MED ORDER — HYDRALAZINE HCL 20 MG/ML IJ SOLN: 10.0000 mg | INTRAMUSCULAR | Status: DC | PRN

## 2023-03-06 MED ORDER — PROPOFOL 10 MG/ML IV BOLUS
INTRAVENOUS | Status: DC | PRN
  Administered 2023-03-06: 220 mg via INTRAVENOUS

## 2023-03-06 MED ORDER — DOCUSATE SODIUM 100 MG PO CAPS
100.0000 mg | ORAL_CAPSULE | Freq: Two times a day (BID) | ORAL | Status: DC
Start: 2023-03-06 — End: 2023-03-11
  Administered 2023-03-06 – 2023-03-10 (×10): 100 mg via ORAL
  Filled 2023-03-06 (×10): qty 1

## 2023-03-06 MED ORDER — HYDROMORPHONE 1 MG/ML IV SOLN
INTRAVENOUS | Status: DC
  Administered 2023-03-06 (×3): 1 mg via INTRAVENOUS

## 2023-03-06 MED ORDER — HYDROMORPHONE HCL 1 MG/ML IJ SOLN
INTRAMUSCULAR | Status: AC
  Filled 2023-03-06: qty 1

## 2023-03-06 MED ORDER — PHENYLEPHRINE 80 MCG/ML (10ML) SYRINGE FOR IV PUSH (FOR BLOOD PRESSURE SUPPORT)
PREFILLED_SYRINGE | INTRAVENOUS | Status: AC
  Filled 2023-03-06: qty 10

## 2023-03-06 MED ORDER — PHENYLEPHRINE HCL-NACL 20-0.9 MG/250ML-% IV SOLN
INTRAVENOUS | Status: DC | PRN
  Administered 2023-03-06: 40 ug/min via INTRAVENOUS

## 2023-03-06 MED ORDER — LACTATED RINGERS IV SOLN: INTRAVENOUS | Status: DC | PRN

## 2023-03-06 MED ORDER — HYDROMORPHONE 1 MG/ML IV SOLN
INTRAVENOUS | Status: AC
  Administered 2023-03-06: 0.5 mg
  Filled 2023-03-06: qty 30

## 2023-03-06 MED ORDER — TETANUS-DIPHTH-ACELL PERTUSSIS 5-2.5-18.5 LF-MCG/0.5 IM SUSY
0.5000 mL | PREFILLED_SYRINGE | Freq: Once | INTRAMUSCULAR | Status: AC
  Administered 2023-03-06: 0.5 mL via INTRAMUSCULAR
  Filled 2023-03-06: qty 0.5

## 2023-03-06 MED ORDER — DEXMEDETOMIDINE HCL IN NACL 200 MCG/50ML IV SOLN: INTRAVENOUS | Status: DC | PRN

## 2023-03-06 MED ORDER — ACETAMINOPHEN 10 MG/ML IV SOLN
1000.0000 mg | Freq: Four times a day (QID) | INTRAVENOUS | Status: DC
  Administered 2023-03-06: 1000 mg via INTRAVENOUS
  Filled 2023-03-06: qty 100

## 2023-03-06 MED ORDER — FENTANYL CITRATE (PF) 100 MCG/2ML IJ SOLN
INTRAMUSCULAR | Status: DC | PRN
  Administered 2023-03-06: 50 ug via INTRAVENOUS
  Administered 2023-03-06: 150 ug via INTRAVENOUS

## 2023-03-06 MED ORDER — DEXTROSE-SODIUM CHLORIDE 5-0.45 % IV SOLN: INTRAVENOUS | Status: AC

## 2023-03-06 MED ORDER — IOHEXOL 350 MG/ML SOLN
75.0000 mL | Freq: Once | INTRAVENOUS | Status: AC | PRN
  Administered 2023-03-06: 75 mL via INTRAVENOUS

## 2023-03-06 MED ORDER — DIPHENHYDRAMINE HCL 12.5 MG/5ML PO ELIX: 12.5000 mg | ORAL_SOLUTION | Freq: Four times a day (QID) | ORAL | Status: DC | PRN

## 2023-03-06 MED ORDER — NALOXONE HCL 0.4 MG/ML IJ SOLN: 0.4000 mg | INTRAMUSCULAR | Status: DC | PRN

## 2023-03-06 MED ORDER — FENTANYL CITRATE (PF) 250 MCG/5ML IJ SOLN
INTRAMUSCULAR | Status: AC
  Filled 2023-03-06: qty 5

## 2023-03-06 MED ORDER — 0.9 % SODIUM CHLORIDE (POUR BTL) OPTIME
TOPICAL | Status: DC | PRN
  Administered 2023-03-06: 1000 mL
  Administered 2023-03-06: 2000 mL

## 2023-03-06 MED ORDER — ACETAMINOPHEN 500 MG PO TABS: 1000.0000 mg | ORAL_TABLET | Freq: Once | ORAL | Status: DC | PRN

## 2023-03-06 MED ORDER — ROCURONIUM BROMIDE 100 MG/10ML IV SOLN
INTRAVENOUS | Status: DC | PRN
  Administered 2023-03-06: 20 mg via INTRAVENOUS
  Administered 2023-03-06: 80 mg via INTRAVENOUS

## 2023-03-06 MED ORDER — AMISULPRIDE (ANTIEMETIC) 5 MG/2ML IV SOLN: 10.0000 mg | Freq: Once | INTRAVENOUS | Status: DC | PRN

## 2023-03-06 MED ORDER — ONDANSETRON HCL 4 MG/2ML IJ SOLN
INTRAMUSCULAR | Status: AC
  Filled 2023-03-06: qty 2

## 2023-03-06 MED ORDER — ACETAMINOPHEN 10 MG/ML IV SOLN: 1000.0000 mg | Freq: Once | INTRAVENOUS | Status: DC | PRN

## 2023-03-06 MED ORDER — SODIUM CHLORIDE 0.9% FLUSH: 9.0000 mL | INTRAVENOUS | Status: DC | PRN

## 2023-03-06 MED ORDER — DIPHENHYDRAMINE HCL 50 MG/ML IJ SOLN: 12.5000 mg | Freq: Four times a day (QID) | INTRAMUSCULAR | Status: DC | PRN

## 2023-03-06 MED ORDER — METOPROLOL TARTRATE 5 MG/5ML IV SOLN: 5.0000 mg | Freq: Four times a day (QID) | INTRAVENOUS | Status: DC | PRN

## 2023-03-06 MED ORDER — FENTANYL CITRATE (PF) 100 MCG/2ML IJ SOLN: 25.0000 ug | INTRAMUSCULAR | Status: DC | PRN

## 2023-03-06 MED ORDER — METHOCARBAMOL 1000 MG/10ML IJ SOLN
500.0000 mg | Freq: Three times a day (TID) | INTRAMUSCULAR | Status: AC
Start: 2023-03-06 — End: 2023-03-08
  Administered 2023-03-06: 500 mg via INTRAVENOUS
  Filled 2023-03-06 (×2): qty 10

## 2023-03-06 MED ORDER — METHOCARBAMOL 500 MG PO TABS
500.0000 mg | ORAL_TABLET | Freq: Three times a day (TID) | ORAL | Status: AC
Start: 2023-03-06 — End: 2023-03-08
  Administered 2023-03-06 – 2023-03-08 (×8): 500 mg via ORAL
  Filled 2023-03-06 (×8): qty 1

## 2023-03-06 MED ORDER — MIDAZOLAM HCL 2 MG/2ML IJ SOLN
INTRAMUSCULAR | Status: AC
Start: 2023-03-06 — End: ?
  Filled 2023-03-06: qty 2

## 2023-03-06 MED ORDER — CEFAZOLIN SODIUM-DEXTROSE 1-4 GM/50ML-% IV SOLN
INTRAVENOUS | Status: DC | PRN
Start: 2023-03-06 — End: 2023-03-06
  Administered 2023-03-06: 1 g via INTRAVENOUS

## 2023-03-06 MED ORDER — DEXMEDETOMIDINE HCL IN NACL 80 MCG/20ML IV SOLN
INTRAVENOUS | Status: DC | PRN
Start: 2023-03-06 — End: 2023-03-06
  Administered 2023-03-06 (×2): 20 ug via INTRAVENOUS

## 2023-03-06 MED ORDER — HYDROMORPHONE HCL 1 MG/ML IJ SOLN
0.2500 mg | INTRAMUSCULAR | Status: DC | PRN
Start: 2023-03-06 — End: 2023-03-06
  Administered 2023-03-06: 0.5 mg via INTRAVENOUS

## 2023-03-06 SURGICAL SUPPLY — 48 items
BAG COUNTER SPONGE SURGICOUNT (BAG) ×2 IMPLANT
BINDER ABDOMINAL 12 XL 75-84 (SOFTGOODS) IMPLANT
BLADE CLIPPER SURG (BLADE) IMPLANT
CANISTER SUCT 3000ML PPV (MISCELLANEOUS) ×2 IMPLANT
CANISTER WOUND CARE 500ML ATS (WOUND CARE) IMPLANT
CHLORAPREP W/TINT 26 (MISCELLANEOUS) ×2 IMPLANT
COVER SURGICAL LIGHT HANDLE (MISCELLANEOUS) ×2 IMPLANT
DRAPE LAPAROSCOPIC ABDOMINAL (DRAPES) ×2 IMPLANT
DRAPE WARM FLUID 44X44 (DRAPES) ×2 IMPLANT
DRESSING PEEL AND PLAC PRVNA20 (GAUZE/BANDAGES/DRESSINGS) IMPLANT
DRSG OPSITE POSTOP 4X10 (GAUZE/BANDAGES/DRESSINGS) IMPLANT
DRSG OPSITE POSTOP 4X8 (GAUZE/BANDAGES/DRESSINGS) IMPLANT
DRSG PEEL AND PLACE PREVENA 20 (GAUZE/BANDAGES/DRESSINGS) ×2
DRSG TEGADERM 4X4.75 (GAUZE/BANDAGES/DRESSINGS) IMPLANT
ELECT BLADE 6.5 EXT (BLADE) IMPLANT
ELECT CAUTERY BLADE 6.4 (BLADE) ×2 IMPLANT
ELECT REM PT RETURN 9FT ADLT (ELECTROSURGICAL) ×2
ELECTRODE REM PT RTRN 9FT ADLT (ELECTROSURGICAL) ×2 IMPLANT
GAUZE SPONGE 4X4 12PLY STRL LF (GAUZE/BANDAGES/DRESSINGS) IMPLANT
GLOVE BIOGEL PI IND STRL 7.0 (GLOVE) IMPLANT
GLOVE BIOGEL PI IND STRL 8 (GLOVE) IMPLANT
GLOVE BIOGEL PI MICRO STRL 6 (GLOVE) ×2 IMPLANT
GLOVE INDICATOR 6.5 STRL GRN (GLOVE) ×2 IMPLANT
GLOVE SURG SS PI 7.0 STRL IVOR (GLOVE) IMPLANT
GOWN STRL REUS W/ TWL LRG LVL3 (GOWN DISPOSABLE) ×4 IMPLANT
HANDLE SUCTION POOLE (INSTRUMENTS) ×2 IMPLANT
KIT BASIN OR (CUSTOM PROCEDURE TRAY) ×2 IMPLANT
KIT TURNOVER KIT B (KITS) ×2 IMPLANT
LIGASURE IMPACT 36 18CM CVD LR (INSTRUMENTS) IMPLANT
MAT PREVALON FULL STRYKER (MISCELLANEOUS) IMPLANT
NS IRRIG 1000ML POUR BTL (IV SOLUTION) ×4 IMPLANT
PACK GENERAL/GYN (CUSTOM PROCEDURE TRAY) ×2 IMPLANT
PAD ARMBOARD 7.5X6 YLW CONV (MISCELLANEOUS) ×2 IMPLANT
PENCIL SMOKE EVACUATOR (MISCELLANEOUS) ×2 IMPLANT
RETRACTOR WOUND ALXS 34CM XLRG (MISCELLANEOUS) IMPLANT
RTRCTR WOUND ALEXIS 34CM XLRG (MISCELLANEOUS) ×2
SPECIMEN JAR LARGE (MISCELLANEOUS) IMPLANT
SPONGE T-LAP 18X18 ~~LOC~~+RFID (SPONGE) IMPLANT
STAPLER VISISTAT 35W (STAPLE) ×2 IMPLANT
SUCTION POOLE HANDLE (INSTRUMENTS) ×2
SUT PDS AB 1 TP1 96 (SUTURE) ×4 IMPLANT
SUT SILK 2 0 SH CR/8 (SUTURE) ×2 IMPLANT
SUT SILK 2 0 TIES 10X30 (SUTURE) ×2 IMPLANT
SUT SILK 3 0 SH CR/8 (SUTURE) ×2 IMPLANT
SUT SILK 3 0 TIES 10X30 (SUTURE) ×2 IMPLANT
SUT VIC AB 3-0 SH 18 (SUTURE) IMPLANT
TOWEL GREEN STERILE (TOWEL DISPOSABLE) ×2 IMPLANT
TRAY FOLEY MTR SLVR 16FR STAT (SET/KITS/TRAYS/PACK) IMPLANT
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
## 2023-03-06 NOTE — ED Notes (Signed)
Dr. Azucena Cecil arrived

## 2023-03-06 NOTE — Transfer of Care (Signed)
Immediate Anesthesia Transfer of Care Note  Patient: Dennis Jensen  Procedure(s) Performed: EXPLORATORY LAPAROTOMY, HEPATORRAPHY (Abdomen) APPLICATION OF PREVENIA WOUND VAC (Abdomen)  Patient Location: PACU  Anesthesia Type:General  Level of Consciousness: drowsy  Airway & Oxygen Therapy: Patient Spontanous Breathing and Patient connected to face mask oxygen  Post-op Assessment: Report given to RN, Post -op Vital signs reviewed and stable, and Patient moving all extremities  Post vital signs: Reviewed and stable  Last Vitals:  Vitals Value Taken Time  BP 136/92 03/06/23 0418  Temp    Pulse 87 03/06/23 0423  Resp 33 03/06/23 0423  SpO2 100 % 03/06/23 0423  Vitals shown include unfiled device data.  Last Pain:  Vitals:   03/06/23 0207  PainSc: 0-No pain         Complications: No notable events documented.
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
## 2023-03-06 NOTE — Anesthesia Procedure Notes (Signed)
Procedure Name: Intubation Date/Time: 03/06/2023 2:21 AM  Performed by: Tylyn Stankovich T, CRNAPre-anesthesia Checklist: Patient identified, Emergency Drugs available, Suction available and Patient being monitored Patient Re-evaluated:Patient Re-evaluated prior to induction Oxygen Delivery Method: Circle system utilized Preoxygenation: Pre-oxygenation with 100% oxygen Induction Type: IV induction, Rapid sequence and Cricoid Pressure applied Ventilation: Mask ventilation without difficulty Laryngoscope Size: Mac and 4 Grade View: Grade II Tube type: Oral Tube size: 8.0 mm Number of attempts: 1 Airway Equipment and Method: Stylet and Oral airway Placement Confirmation: ETT inserted through vocal cords under direct vision, positive ETCO2 and breath sounds checked- equal and bilateral Secured at: 23 cm Tube secured with: Tape Dental Injury: Teeth and Oropharynx as per pre-operative assessment
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
## 2023-03-06 NOTE — ED Notes (Signed)
Dr. Manus Gunning, [place] PA, Fulton Mole RN, Jodie Echevaria Paramedic, Eileen Stanford CNA

## 2023-03-06 NOTE — ED Notes (Signed)
Positive fast exam

## 2023-03-06 NOTE — Progress Notes (Signed)
Transition of Care [facility]) - CAGE-AID Screening   Patient Details  Name: Dennis Jensen MRN: [id number] Date of Birth: 1980/04/12  Transition [facility] Care [facility]) CM/SW Contact:    Katha Hamming, RN Phone Number: 03/06/2023, 8:41 PM     CAGE-AID Screening:    Have You Ever Felt You Ought to Cut Down on Your Drinking or Drug Use?: No Have People Annoyed You By [employer] Your Drinking Or Drug Use?: No Have You Felt Bad Or Guilty About Your Drinking Or Drug Use?: No Have You Ever Had a Drink or Used Drugs First Thing In The Morning to Steady Your Nerves or to Get Rid of a Hangover?: No CAGE-AID Score: 0  Substance Abuse Education Offered: No (not indicated)

## 2023-03-06 NOTE — Progress Notes (Signed)
Patient examined this afternoon. Working with PT. Reports pain is controlled, denies nausea/vomiting. Hemodynamically stable, postop hgb is 12. Will remove NG tube and advance to clear liquids.

## 2023-03-07 ENCOUNTER — Encounter (HOSPITAL_COMMUNITY): Payer: Self-pay | Admitting: General Surgery

## 2023-03-07 MED ORDER — OXYCODONE HCL 5 MG PO TABS: 5.0000 mg | ORAL_TABLET | Freq: Four times a day (QID) | ORAL | Status: DC | PRN

## 2023-03-07 MED ORDER — HYDROMORPHONE HCL 1 MG/ML IJ SOLN: 1.0000 mg | INTRAMUSCULAR | Status: DC | PRN
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
## 2023-03-07 NOTE — Progress Notes (Signed)
  Trauma Service Note  Chief Complaint/Subjective: Tolerating liquids, no flatus or BM yet, pain controlled  Objective: Vital signs in last 24 hours: Temp:  [97.9 F (36.6 C)-99.2 F (37.3 C)] 99.1 F (37.3 C) (11/29 0821) Pulse Rate:  [83-104] 88 (11/29 0821) Resp:  [15-30] 18 (11/29 0913) BP: (133-157)/(87-96) 143/90 (11/29 0821) SpO2:  [92 %-100 %] 97 % (11/29 0913) FiO2 (%):  [0 %-21 %] 0 % (11/29 0913) Last BM Date :  (PTA)  Intake/Output from previous day: 11/28 0701 - 11/29 0700 In: 1089.3 [P.O.:480; I.V.:573.7; IV Piggyback:35.6] Out: 1200 [Urine:1200]  General: NAD Lungs: nonlabored Abd: soft, superficial wound vac in place Extremities: no edema Neuro: AOx4  Lab Results:  Recent Labs    03/06/23 0123 03/06/23 0133 03/06/23 0711  WBC 7.2  --  8.7  HGB 14.9 17.0 12.4*  HCT 43.8 50.0 36.8*  PLT 213  --  180   Recent Labs    03/06/23 0123 03/06/23 0133 03/06/23 0711  NA 134* 137 133*  K 3.8 3.8 3.8  CL 99 99 100  CO2 20*  --  20*  GLUCOSE 149* 154* 131*  BUN 12 16 8   CREATININE 1.34* 1.60* 1.03  CALCIUM 9.2  --  8.5*   Recent Labs    03/06/23 0123  LABPROT 12.8  INR 1.0   No results for input(s): "PHART", "HCO3" in the last 72 hours.  Invalid input(s): "PCO2", "PO2"  Anti-infectives: Anti-infectives (From admission, onward)    Start     Dose/Rate Route Frequency Ordered Stop   03/06/23 0130  ceFAZolin (ANCEF) IVPB 2g/100 mL premix        2 g 200 mL/hr over 30 Minutes Intravenous  Once 03/06/23 0126 03/06/23 0201       Assessment/Plan: s/p Procedure(s): EXPLORATORY LAPAROTOMY, HEPATORRAPHY APPLICATION OF PREVENIA WOUND VAC Advance diet Stop PCA, start oral pain meds Await return of bowel function Plan to remove wound vac POD 4-5   LOS: 1 day   I reviewed last 24 h vitals and pain scores, last 48 h intake and output, last 24 h labs and trends, and last 24 h imaging results.  This care required high  level of medical decision  making.   De Blanch Dorotha Hirschi Trauma Surgeon [phone] Surgery at [facility] 03/07/2023
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
## 2023-03-08 LAB — CBC
HCT: 43.4 % (ref 39.0–52.0)
Hemoglobin: 15 g/dL (ref 13.0–17.0)
MCH: 28.5 pg (ref 26.0–34.0)
MCHC: 34.6 g/dL (ref 30.0–36.0)
MCV: 82.4 fL (ref 80.0–100.0)
Platelets: 198 10*3/uL (ref 150–400)
RBC: 5.27 MIL/uL (ref 4.22–5.81)
RDW: 17.7 % — ABNORMAL HIGH (ref 11.5–15.5)
WBC: 10.3 10*3/uL (ref 4.0–10.5)
nRBC: 0 % (ref 0.0–0.2)

## 2023-03-08 LAB — BASIC METABOLIC PANEL
Anion gap: 12 (ref 5–15)
BUN: 10 mg/dL (ref 6–20)
CO2: 19 mmol/L — ABNORMAL LOW (ref 22–32)
Calcium: 9.8 mg/dL (ref 8.9–10.3)
Chloride: 102 mmol/L (ref 98–111)
Creatinine, Ser: 1 mg/dL (ref 0.61–1.24)
GFR, Estimated: >60 mL/min (ref >60)
Glucose, Bld: 117 mg/dL — ABNORMAL HIGH (ref 70–99)
Potassium: 3.9 mmol/L (ref 3.5–5.1)
Sodium: 133 mmol/L — ABNORMAL LOW (ref 135–145)

## 2023-03-08 MED ORDER — ENOXAPARIN SODIUM 30 MG/0.3ML IJ SOSY
30.0000 mg | PREFILLED_SYRINGE | Freq: Two times a day (BID) | INTRAMUSCULAR | Status: DC
  Administered 2023-03-08 – 2023-03-11 (×7): 30 mg via SUBCUTANEOUS
  Filled 2023-03-08 (×7): qty 0.3

## 2023-03-08 MED ORDER — OXYCODONE HCL 5 MG PO TABS: 5.0000 mg | ORAL_TABLET | Freq: Four times a day (QID) | ORAL | Status: DC | PRN

## 2023-03-08 MED ORDER — ONDANSETRON HCL 4 MG/2ML IJ SOLN
4.0000 mg | Freq: Four times a day (QID) | INTRAMUSCULAR | Status: DC | PRN
  Administered 2023-03-08 – 2023-03-09 (×2): 4 mg via INTRAVENOUS
  Filled 2023-03-08 (×2): qty 2

## 2023-03-08 MED ORDER — HYDROMORPHONE HCL 1 MG/ML IJ SOLN: 1.0000 mg | INTRAMUSCULAR | Status: DC | PRN

## 2023-03-08 MED ORDER — AMLODIPINE BESYLATE 10 MG PO TABS
10.0000 mg | ORAL_TABLET | Freq: Every day | ORAL | Status: DC
  Administered 2023-03-08 – 2023-03-11 (×4): 10 mg via ORAL
  Filled 2023-03-08 (×4): qty 1

## 2023-03-08 MED ORDER — HYDROCHLOROTHIAZIDE 25 MG PO TABS
25.0000 mg | ORAL_TABLET | Freq: Every day | ORAL | Status: DC
  Administered 2023-03-08 – 2023-03-11 (×4): 25 mg via ORAL
  Filled 2023-03-08 (×4): qty 1

## 2023-03-08 MED ORDER — POLYETHYLENE GLYCOL 3350 17 G PO PACK
17.0000 g | PACK | Freq: Every day | ORAL | Status: DC
  Administered 2023-03-08: 17 g via ORAL
  Filled 2023-03-08: qty 1
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
## 2023-03-08 NOTE — Plan of Care (Signed)
  Problem: Education: Goal: Knowledge of General Education information will improve Description: Including pain rating scale, medication(s)/side effects and non-pharmacologic comfort measures Outcome: Progressing   Problem: Health Behavior/Discharge Planning: Goal: Ability to manage health-related needs will improve Outcome: Progressing   Problem: Activity: Goal: Risk for activity intolerance will decrease Outcome: Progressing   Problem: Pain Management: Goal: General experience of comfort will improve Outcome: Progressing

## 2023-03-09 ENCOUNTER — Inpatient Hospital Stay (HOSPITAL_COMMUNITY): Payer: 59

## 2023-03-09 MED ORDER — BISACODYL 10 MG RE SUPP: 10.0000 mg | Freq: Every day | RECTAL | Status: DC | PRN

## 2023-03-09 MED ORDER — POLYETHYLENE GLYCOL 3350 17 G PO PACK
17.0000 g | PACK | Freq: Two times a day (BID) | ORAL | Status: DC
  Administered 2023-03-09 – 2023-03-10 (×4): 17 g via ORAL
  Filled 2023-03-09 (×5): qty 1
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
## 2023-03-09 NOTE — Progress Notes (Signed)
   03/09/23 1750  Unmeasured Output  Emesis Occurrence 1   RN to pt room. Pt stated he vomited again. MD aware, NPO orders received.

## 2023-03-09 NOTE — Plan of Care (Signed)
  Problem: Education: Goal: Knowledge of General Education information will improve Description: Including pain rating scale, medication(s)/side effects and non-pharmacologic comfort measures Outcome: Progressing   Problem: Health Behavior/Discharge Planning: Goal: Ability to manage health-related needs will improve Outcome: Progressing   Problem: Clinical Measurements: Goal: Ability to maintain clinical measurements within normal limits will improve Outcome: Progressing Goal: Will remain free from infection Outcome: Progressing Goal: Respiratory complications will improve Outcome: Progressing   Problem: Activity: Goal: Risk for activity intolerance will decrease Outcome: Progressing   Problem: Coping: Goal: Level of anxiety will decrease Outcome: Progressing   Problem: Pain Management: Goal: General experience of comfort will improve Outcome: Progressing   Problem: Safety: Goal: Ability to remain free from injury will improve Outcome: Progressing
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
## 2023-03-10 LAB — BASIC METABOLIC PANEL
Anion gap: 12 (ref 5–15)
BUN: 29 mg/dL — ABNORMAL HIGH (ref 6–20)
CO2: 22 mmol/L (ref 22–32)
Calcium: 9.6 mg/dL (ref 8.9–10.3)
Chloride: 100 mmol/L (ref 98–111)
Creatinine, Ser: 1.5 mg/dL — ABNORMAL HIGH (ref 0.61–1.24)
GFR, Estimated: 60 mL/min — ABNORMAL LOW (ref >60)
Glucose, Bld: 130 mg/dL — ABNORMAL HIGH (ref 70–99)
Potassium: 3.9 mmol/L (ref 3.5–5.1)
Sodium: 134 mmol/L — ABNORMAL LOW (ref 135–145)

## 2023-03-10 LAB — CBC
HCT: 44.4 % (ref 39.0–52.0)
Hemoglobin: 15 g/dL (ref 13.0–17.0)
MCH: 27.5 pg (ref 26.0–34.0)
MCHC: 33.8 g/dL (ref 30.0–36.0)
MCV: 81.5 fL (ref 80.0–100.0)
Platelets: 282 10*3/uL (ref 150–400)
RBC: 5.45 MIL/uL (ref 4.22–5.81)
RDW: 17.2 % — ABNORMAL HIGH (ref 11.5–15.5)
WBC: 8.1 10*3/uL (ref 4.0–10.5)
nRBC: 0 % (ref 0.0–0.2)

## 2023-03-10 NOTE — Plan of Care (Signed)
Yaya continues to deny pain and is tolerating diet advancement well. His wound vac continues to have minimal output. He verbalizes understanding of his plan of care and denies any questions. We have discussed the importance of ambulation and mobility to enhance his recovery.

## 2023-03-10 NOTE — Plan of Care (Signed)
  Problem: Education: Goal: Knowledge of General Education information will improve Description: Including pain rating scale, medication(s)/side effects and non-pharmacologic comfort measures Outcome: Progressing   Problem: Health Behavior/Discharge Planning: Goal: Ability to manage health-related needs will improve Outcome: Progressing   Problem: Clinical Measurements: Goal: Ability to maintain clinical measurements within normal limits will improve Outcome: Progressing Goal: Will remain free from infection Outcome: Progressing Goal: Diagnostic test results will improve Outcome: Progressing Goal: Respiratory complications will improve Outcome: Progressing Goal: Cardiovascular complication will be avoided Outcome: Progressing   Problem: Activity: Goal: Risk for activity intolerance will decrease Outcome: Progressing   Problem: Nutrition: Goal: Adequate nutrition will be maintained Outcome: Progressing   Problem: Coping: Goal: Level of anxiety will decrease Outcome: Progressing   Problem: Elimination: Goal: Will not experience complications related to bowel motility Outcome: Progressing Goal: Will not experience complications related to urinary retention Outcome: Progressing   Problem: Pain Management: Goal: General experience of comfort will improve Outcome: Progressing   Problem: Safety: Goal: Ability to remain free from injury will improve Outcome: Progressing   Problem: Skin Integrity: Goal: Risk for impaired skin integrity will decrease Outcome: Progressing   Problem: Skin Integrity: Goal: Demonstration of wound healing without infection will improve Outcome: Progressing
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
## 2023-03-11 ENCOUNTER — Other Ambulatory Visit (HOSPITAL_COMMUNITY): Payer: Self-pay

## 2023-03-11 LAB — BASIC METABOLIC PANEL
Anion gap: 16 — ABNORMAL HIGH (ref 5–15)
BUN: 25 mg/dL — ABNORMAL HIGH (ref 6–20)
CO2: 19 mmol/L — ABNORMAL LOW (ref 22–32)
Calcium: 9.6 mg/dL (ref 8.9–10.3)
Chloride: 95 mmol/L — ABNORMAL LOW (ref 98–111)
Creatinine, Ser: 1.49 mg/dL — ABNORMAL HIGH (ref 0.61–1.24)
GFR, Estimated: >60 mL/min (ref >60)
Glucose, Bld: 101 mg/dL — ABNORMAL HIGH (ref 70–99)
Potassium: 3.7 mmol/L (ref 3.5–5.1)
Sodium: 130 mmol/L — ABNORMAL LOW (ref 135–145)

## 2023-03-11 MED ORDER — ACETAMINOPHEN 500 MG PO TABS: 1000.0000 mg | ORAL_TABLET | Freq: Four times a day (QID) | ORAL | Status: AC | PRN

## 2023-03-11 MED ORDER — OXYCODONE HCL 5 MG PO TABS
5.0000 mg | ORAL_TABLET | Freq: Four times a day (QID) | ORAL | 0 refills | Status: AC | PRN
  Filled 2023-03-11: qty 20, 5d supply, fill #0

## 2023-03-11 NOTE — Plan of Care (Signed)
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
## 2023-03-13 NOTE — Anesthesia Postprocedure Evaluation (Signed)
Anesthesia Post Note  Patient: Dennis Jensen  Procedure(s) Performed: EXPLORATORY LAPAROTOMY, HEPATORRAPHY (Abdomen) APPLICATION OF PREVENIA WOUND VAC (Abdomen)     Patient location during evaluation: PACU Anesthesia Type: General Level of consciousness: awake and patient cooperative Pain management: pain level controlled Vital Signs Assessment: post-procedure vital signs reviewed and stable Respiratory status: spontaneous breathing, nonlabored ventilation, respiratory function stable and patient connected to nasal cannula oxygen Cardiovascular status: blood pressure returned to baseline and stable Postop Assessment: no apparent nausea or vomiting Anesthetic complications: no   No notable events documented.            Jezebel Pollet
